# Patient Record
Sex: Female | Born: 1985 | Hispanic: No | Marital: Married | Smoking: Never smoker
Health system: Southern US, Community
[De-identification: ages and names within clinical notes are randomized; demographics above are authoritative.]

## PROBLEM LIST (undated history)

## (undated) ENCOUNTER — Inpatient Hospital Stay (HOSPITAL_COMMUNITY): Payer: Self-pay

---

## 2015-05-31 ENCOUNTER — Inpatient Hospital Stay (HOSPITAL_COMMUNITY)
Admission: AD | Admit: 2015-05-31 | Discharge: 2015-06-03 | DRG: 782 | Disposition: A | Discharge status: Home / Self Care | Payer: Managed Care, Other (non HMO) | Source: Ambulatory Visit | Attending: Obstetrics and Gynecology | Admitting: Obstetrics and Gynecology

## 2015-05-31 ENCOUNTER — Inpatient Hospital Stay (HOSPITAL_COMMUNITY): Payer: Managed Care, Other (non HMO)

## 2015-05-31 ENCOUNTER — Encounter (HOSPITAL_COMMUNITY): Payer: Self-pay

## 2015-05-31 DIAGNOSIS — O3432 Maternal care for cervical incompetence, second trimester: Secondary | ICD-10-CM | POA: Diagnosis present

## 2015-05-31 DIAGNOSIS — D563 Thalassemia minor: Secondary | ICD-10-CM | POA: Diagnosis present

## 2015-05-31 DIAGNOSIS — O3433 Maternal care for cervical incompetence, third trimester: Secondary | ICD-10-CM

## 2015-05-31 DIAGNOSIS — Z833 Family history of diabetes mellitus: Secondary | ICD-10-CM | POA: Diagnosis not present

## 2015-05-31 DIAGNOSIS — Z8249 Family history of ischemic heart disease and other diseases of the circulatory system: Secondary | ICD-10-CM | POA: Diagnosis not present

## 2015-05-31 DIAGNOSIS — Z3A27 27 weeks gestation of pregnancy: Secondary | ICD-10-CM

## 2015-05-31 DIAGNOSIS — Z1389 Encounter for screening for other disorder: Secondary | ICD-10-CM

## 2015-05-31 LAB — CBC
HEMATOCRIT: 29.3 % — AB (ref 36.0–46.0)
HEMOGLOBIN: 9.2 g/dL — AB (ref 12.0–15.0)
MCH: 25.6 pg — ABNORMAL LOW (ref 26.0–34.0)
MCHC: 31.4 g/dL (ref 30.0–36.0)
MCV: 81.6 fL (ref 78.0–100.0)
Platelets: 236 10*3/uL (ref 150–400)
RBC: 3.59 MIL/uL — ABNORMAL LOW (ref 3.87–5.11)
RDW: 18 % — AB (ref 11.5–15.5)
WBC: 8.3 10*3/uL (ref 4.0–10.5)

## 2015-05-31 LAB — ABO/RH: ABO/RH(D): O POS

## 2015-05-31 LAB — TYPE AND SCREEN
ABO/RH(D): O POS
Antibody Screen: NEGATIVE

## 2015-05-31 MED ORDER — BETAMETHASONE SOD PHOS & ACET 6 (3-3) MG/ML IJ SUSP
12.0000 mg | INTRAMUSCULAR | Status: AC
  Administered 2015-05-31 – 2015-06-01 (×2): 12 mg via INTRAMUSCULAR
  Filled 2015-05-31 (×2): qty 2

## 2015-05-31 MED ORDER — ACETAMINOPHEN 325 MG PO TABS: 650.0000 mg | ORAL_TABLET | ORAL | Status: DC | PRN

## 2015-05-31 MED ORDER — ZOLPIDEM TARTRATE 5 MG PO TABS: 5.0000 mg | ORAL_TABLET | Freq: Every evening | ORAL | Status: DC | PRN

## 2015-05-31 MED ORDER — PRENATAL MULTIVITAMIN CH
1.0000 | ORAL_TABLET | Freq: Every day | ORAL | Status: DC
  Administered 2015-06-01 – 2015-06-03 (×3): 1 via ORAL
  Filled 2015-05-31 (×3): qty 1

## 2015-05-31 MED ORDER — CALCIUM CARBONATE ANTACID 500 MG PO CHEW: 2.0000 | CHEWABLE_TABLET | ORAL | Status: DC | PRN

## 2015-05-31 MED ORDER — PROGESTERONE MICRONIZED 200 MG PO CAPS
200.0000 mg | ORAL_CAPSULE | Freq: Every day | ORAL | Status: DC
  Administered 2015-05-31 – 2015-06-02 (×3): 200 mg via VAGINAL
  Filled 2015-05-31 (×3): qty 1

## 2015-05-31 MED ORDER — DOCUSATE SODIUM 100 MG PO CAPS
100.0000 mg | ORAL_CAPSULE | Freq: Every day | ORAL | Status: DC
  Administered 2015-06-01: 100 mg via ORAL
  Filled 2015-05-31 (×2): qty 1

## 2015-05-31 NOTE — Consult Note (Signed)
[facility]  Pren[facility] Consult       05/31/2015  10:29 PM   I was asked by Dr. Cherly Hensenousins to consult on this patient for possible preterm delivery.  I had the pleasure of meeting with Mrs. Tracy [name] and her husband today.  She is a 30 y/o G1P0 at 3727 and 1/[redacted] weeks gestation who was admitted today for cervical change.  Her pregnancy has been complicated by cervical shortening since 20 weeks but has always been closed.  On today's exam, she was noted to be 1 cm dilated and 70% effaced.  She is not contracting.  She received one dose of BMZ today and will get a 2nd tomorrow.  If she remains stable, outpatient management might be possible.    I explained that the neonatal intensive care team would be present for the delivery and outlined the likely delivery room course for this baby including routine resuscitation and NRP-guided approaches to the treatment of respiratory distress. We discussed other common problems associated with prematurity including respiratory distress syndrome/CLD, apnea, feeding issues, temperature regulation, and infection risk.  We briefly discussed IVH/PVL, ROP, and NEC and that these are complications associated with prematurity, but that by 30 weeks are uncommon.    We discussed the average length of stay but I noted that the actual LOS would depend on the severity of problems encountered and response to treatments.  We discussed visitation policies and the resources available while her child is in the hospital.  We discussed the importance of good nutrition and various methods of providing nutrition (parenteral hyperalimentation, gavage feedings and/or oral feeding). We discussed the benefits of human milk. I encouraged breast feeding and pumping soon after birth and outlined resources that are available to support breast feeding.  She does intend to provide breastmilk.    Thank you for involving us in the care of this patient. A member of our team will be  available should the family have additional questions.  Time for consultation approximately 45 minutes.   _____________________ Electronically Signed By: Maryan CharLindsey Perley Arthurs, MD Neonatologist

## 2015-05-31 NOTE — H&P (Signed)
  OB ADMISSION/ HISTORY & PHYSICAL:  Admission Date: 05/31/2015  3:46 PM  Admit Diagnosis: 27.[redacted] weeks gestation, cervical incompetence  Tracy [name] is a 30 y.o. female presenting for cervical shortening with new onset preterm cervical dilation.  Prenatal History:   EDC: 08/29/[facility]  Primary Ob Provider: Raelyn Moraolitta Dawson, CNM and co-management with Dr. Billy Coastaavon Prenatal course complicated by cervical shortening first detected at 20 wks by CUS. CL stable on vaginal Prometrium, 1.8 cm today. Alpha thalassemia trait. Isolated EIF.  Prenatal Labs: ABO, Rh:   O Pos Antibody:  Neg Rubella:   Immune RPR:   Neg HBsAg:   Neg HIV:   Neg GBS:   pending 1 hr GTT: nml Genetic screen: declined  Medical / Surgical History :  Past medical history: none  Past surgical history: none  Family History: HTN-mother, DM-PGM, PGF   Social History: married, no ETOH, tobacco, or DA  Allergies: Review of patient's allergies indicates no known allergies.   Current Medications at time of admission:  Prior to Admission medications     Prometrium 200mg  pv qhs PNV 1 po daily  Review of Systems: +FM + occ. tightening No VB No LOF No ctx  Physical Exam:  VS: 123/72, 78, 16, 98.3  General: alert and oriented, appears comfortable Heart: RRR Lungs: Clear lung fields Abdomen: Gravid, soft and non-tender, non-distended / uterus: gravid, non-tender Extremities: No edema Genitalia / VE:  deferred; SVE 1/70/-4, vtx (in office) FHR: 145 bpm/ mod variability / + accelerations / no decelerations TOCO: none  Assessment: 27.[redacted] weeks gestation Cervical incompetence FHR category I  Plan:  Admit to Ante, continuous EFM, NICU & MFM cosult, BMZ, Sono for EFW, continue Prometrium Dr. Billy Coastaavon & Dr. Cherly Hensenousins aware of admission / MD to follow GBS & fFN pending (collected at office)   Donette LarryBHAMBRI, Tracy [name], N MSN, CNM 05/31/2015, 4:24 PM

## 2015-05-31 NOTE — Consult Note (Signed)
MFM Note  Ms. Sibyl ParrChapman is a 30 year old AA G1 female at 27+1 weeks who was admitted this afternoon from the office due to cervical change on digital exam. Her prenatal course has been complicated by cervical shortening discovered during her anatomy US at 20 weeks (1.7 - 2.1 cms without funneling). At that time, she was started on vaginal Prometrium nightly. Close surveillance of the CL over that past few weeks showed varying lengths from 1.1 to 2.9 cms without funneling. Today, the CL by US was 1.8 cms; however, her digital exam revealed the cervix to be 1/70%/-4. It had been closed on all previous exams. She denies contractions or vaginal bleeding. Reports good fetal movement.  Ultrasound today: cephalic presentation; normal fetal anatomy (limited views of CI); normal AFV; EFW at the 76th %tile (1217 grams, 2+11)  Assessment: 1) SIUP at 27+1 weeks 2) Premature cervical dilation/cx insufficiency 3) Reassuring fetal status 4) Alpha thal carrier  Suggestions:  - agree with giving course of BMZ and obtaining a NICU consult  - continue nightly vaginal progesterone  - limited activity but not strict BR  - recheck cervix by digital exam in ~ 48 hours; if no significant change, consider outpt management with limited activity at home  Please call with any questions or concerns.  (Face-to-face consultation with patient: 30 min)

## 2015-06-01 NOTE — Progress Notes (Addendum)
Patient ID: Tracy AmslerCynthia [name], female   DOB: [birth year], 30 y.o.   MRN: [id number] HD#2 27 2/7 wk IUP Preterm cervical change  S: No complaints. Denies contractions, bleeding or LOF No CP or SOB Good FM  O: BP 136/70 mmHg  Pulse 73  Temp(Src) 98.1 F (36.7 C) (Oral)  Resp 18  Ht 5\' 7"  (1.702 m)  Wt 98.884 kg (218 lb)  BMI 34.14 kg/m2  LMP 11/22/2014 (Exact Date)  HEENT: Normal Neck: supple with FROM Lungs: CTA CV: RRR ABD: Gravid, NT No CVAT Ext : Neg c/c/e Pelvic deferred Neuro: nonfocal Skin intact  CBC    Component Value Date/Time   WBC 8.3 05/31/2015 1637   RBC 3.59* 05/31/2015 1637   HGB 9.2* 05/31/2015 1637   HCT 29.3* 05/31/2015 1637   PLT 236 05/31/2015 1637   MCV 81.6 05/31/2015 1637   MCH 25.6* 05/31/2015 1637   MCHC 31.4 05/31/2015 1637   RDW 18.0* 05/31/2015 1637   MFM sono c/w AGA, nl AFI, cephalic presentation  FHR 140s-150s. BTBV > 5, no decels, 10x10 accels, rare contractions  IMP: 27w 2d IUP History of preterm cervical change on Prometrium now with cervical dilatation No risk factors for cervical insufficiency, no history of preterm birth  Plan: DC continuous monitoring Monitor s/s PTL BMZ series Will rpt VE Monday and possible dc if stable

## 2015-06-02 NOTE — Progress Notes (Signed)
Patient ID: Tracy AmslerCynthia Crull, female   DOB: [birth year], 30 y.o.   MRN: [id number] HD#3 27 3/7 wk IUP Preterm cervical change  S: No complaints. Denies contractions, bleeding or LOF No CP or SOB Good FM  O: BP 141/77 mmHg  Pulse 77  Temp(Src) 98.1 F (36.7 C) (Oral)  Resp 16  Ht 5\' 7"  (1.702 m)  Wt 98.884 kg (218 lb)  BMI 34.14 kg/m2  SpO2 99%  LMP 11/22/2014 (Exact Date)  HEENT: Normal Neck: supple with FROM Lungs: CTA CV: RRR ABD: Gravid, NT No CVAT Ext : Neg c/c/e Pelvic deferred Neuro: nonfocal Skin intact  CBC    Component Value Date/Time   WBC 8.3 05/31/2015 1637   RBC 3.59* 05/31/2015 1637   HGB 9.2* 05/31/2015 1637   HCT 29.3* 05/31/2015 1637   PLT 236 05/31/2015 1637   MCV 81.6 05/31/2015 1637   MCH 25.6* 05/31/2015 1637   MCHC 31.4 05/31/2015 1637   RDW 18.0* 05/31/2015 1637   MFM sono c/w AGA, nl AFI, cephalic presentation  FHR 140s-150s. BTBV > 5, no decels, 10x10 accels, rare contractions Reactive NST x 2  IMP: 27w 3d IUP History of preterm cervical change on Prometrium now with cervical dilatation, no evidence of PTL No risk factors for cervical insufficiency, no history of preterm birth  Plan: Monitor q shift NICU consult done Monitor s/s PTL BMZ series complete Will rpt VE Monday and possible dc if stable

## 2015-06-03 NOTE — Progress Notes (Signed)
Patient ID: Tracy AmslerCynthia [name], female   DOB: 11-28-1985, 30 y.o.   MRN: [id number] HD#4 27 4/7 wk IUP Preterm cervical change  S: No complaints. Denies contractions, bleeding or LOF No CP or SOB Good FM  O: BP 131/73 mmHg  Pulse 73  Temp(Src) 98.3 F (36.8 C) (Oral)  Resp 16  Ht 5\' 7"  (1.702 m)  Wt 98.884 kg (218 lb)  BMI 34.14 kg/m2  SpO2 99%  LMP 11/22/2014 (Exact Date)  HEENT: Normal Neck: supple with FROM Lungs: CTA CV: RRR ABD: Gravid, NT No CVAT Ext : Neg c/c/e Pelvic FT/70/-3 Neuro: nonfocal Skin intact  CBC    Component Value Date/Time   WBC 8.3 05/31/2015 1637   RBC 3.59* 05/31/2015 1637   HGB 9.2* 05/31/2015 1637   HCT 29.3* 05/31/2015 1637   PLT 236 05/31/2015 1637   MCV 81.6 05/31/2015 1637   MCH 25.6* 05/31/2015 1637   MCHC 31.4 05/31/2015 1637   RDW 18.0* 05/31/2015 1637   MFM sono c/w AGA, nl AFI, cephalic presentation FFN neg  FHR 140s-160s. BTBV > 5, no decels, 10x10 accels, rare contractions Reactive NST x 3  IMP: 27w 4d IUP History of preterm cervical change on Prometrium now with cervical dilatation-stable on fu exam No evidence of PTL No risk factors for cervical insufficiency, no history of preterm birth  Plan: DC home Fu office one week NICU consult done Monitor s/s PTL BMZ series complete Vaginal prometrium qhs

## 2015-06-03 NOTE — Discharge Instructions (Signed)

## 2015-06-09 NOTE — Discharge Summary (Signed)
NAMMarya Amsler:  [place], [place]             ACCOUNT NO.:  [id number]  MEDICAL RECORD NO.:  [id number]  LOCATION:  9156                          FACILITY:  [facility]  PHYSICIAN:  Lenoard Adenichard J. Fernandez Kenley, M.D.DATE OF BIRTH:  December 29, 1985  DATE OF ADMISSION:  05/31/2015 DATE OF DISCHARGE:  06/03/2015                              DISCHARGE SUMMARY   Patient is admitted for preterm cervical change 27 weeks with cervical dilatation noted on May 31, 2015.  Hospital course uncomplicated.  No contraction was noted.  Betamethasone series given.  No active signs of infection.  Cervical exam was stable on hospital day #3.  She was discharged to home on bedrest preterm labor precautions.  DISCHARGE MEDICATIONS:  Prometrium and prenatal vitamins.  She is to follow up in the office in 1 week.     Lenoard Adenichard J. Cherica Heiden, M.D.     RJT/MEDQ  D:  06/09/2015  T:  06/09/2015  Job:  [id number]

## 2015-07-30 ENCOUNTER — Inpatient Hospital Stay (HOSPITAL_COMMUNITY): Payer: Managed Care, Other (non HMO) | Admitting: Certified Registered Nurse Anesthetist

## 2015-07-30 ENCOUNTER — Encounter (HOSPITAL_COMMUNITY)
Admission: AD | Disposition: A | Discharge status: Home / Self Care | Payer: Self-pay | Source: Ambulatory Visit | Attending: Obstetrics & Gynecology

## 2015-07-30 ENCOUNTER — Inpatient Hospital Stay (HOSPITAL_COMMUNITY): Payer: Managed Care, Other (non HMO)

## 2015-07-30 ENCOUNTER — Inpatient Hospital Stay (HOSPITAL_COMMUNITY)
Admission: AD | Admit: 2015-07-30 | Discharge: 2015-08-02 | DRG: 765 | Disposition: A | Discharge status: Home / Self Care | Payer: Managed Care, Other (non HMO) | Source: Ambulatory Visit | Attending: Obstetrics & Gynecology | Admitting: Obstetrics & Gynecology

## 2015-07-30 ENCOUNTER — Encounter (HOSPITAL_COMMUNITY): Payer: Self-pay | Admitting: *Deleted

## 2015-07-30 DIAGNOSIS — D62 Acute posthemorrhagic anemia: Secondary | ICD-10-CM

## 2015-07-30 DIAGNOSIS — Z3A35 35 weeks gestation of pregnancy: Secondary | ICD-10-CM

## 2015-07-30 DIAGNOSIS — Z8249 Family history of ischemic heart disease and other diseases of the circulatory system: Secondary | ICD-10-CM

## 2015-07-30 DIAGNOSIS — Z9889 Other specified postprocedural states: Secondary | ICD-10-CM

## 2015-07-30 DIAGNOSIS — O9081 Anemia of the puerperium: Secondary | ICD-10-CM | POA: Diagnosis not present

## 2015-07-30 DIAGNOSIS — O134 Gestational [pregnancy-induced] hypertension without significant proteinuria, complicating childbirth: Secondary | ICD-10-CM

## 2015-07-30 DIAGNOSIS — O288 Other abnormal findings on antenatal screening of mother: Secondary | ICD-10-CM

## 2015-07-30 DIAGNOSIS — Z833 Family history of diabetes mellitus: Secondary | ICD-10-CM

## 2015-07-30 DIAGNOSIS — O368130 Decreased fetal movements, third trimester, not applicable or unspecified: Principal | ICD-10-CM | POA: Diagnosis present

## 2015-07-30 DIAGNOSIS — O368190 Decreased fetal movements, unspecified trimester, not applicable or unspecified: Secondary | ICD-10-CM

## 2015-07-30 LAB — COMPREHENSIVE METABOLIC PANEL
ALT: 12 U/L — ABNORMAL LOW (ref 14–54)
AST: 20 U/L (ref 15–41)
Albumin: 2.9 g/dL — ABNORMAL LOW (ref 3.5–5.0)
Alkaline Phosphatase: 121 U/L (ref 38–126)
Anion gap: 7 (ref 5–15)
BUN: 5 mg/dL — ABNORMAL LOW (ref 6–20)
CO2: 20 mmol/L — ABNORMAL LOW (ref 22–32)
Calcium: 9 mg/dL (ref 8.9–10.3)
Chloride: 108 mmol/L (ref 101–111)
Creatinine, Ser: 0.69 mg/dL (ref 0.44–1.00)
GFR calc Af Amer: >60 mL/min (ref >60)
GFR calc non Af Amer: >60 mL/min (ref >60)
Glucose, Bld: 88 mg/dL (ref 65–99)
Potassium: 3.6 mmol/L (ref 3.5–5.1)
Sodium: 135 mmol/L (ref 135–145)
Total Bilirubin: 0.5 mg/dL (ref 0.3–1.2)
Total Protein: 7 g/dL (ref 6.5–8.1)

## 2015-07-30 LAB — CBC
HCT: 34.5 % — ABNORMAL LOW (ref 36.0–46.0)
Hemoglobin: 11.4 g/dL — ABNORMAL LOW (ref 12.0–15.0)
MCH: 28.1 pg (ref 26.0–34.0)
MCHC: 33 g/dL (ref 30.0–36.0)
MCV: 85.2 fL (ref 78.0–100.0)
Platelets: 205 10*3/uL (ref 150–400)
RBC: 4.05 MIL/uL (ref 3.87–5.11)
RDW: 18 % — ABNORMAL HIGH (ref 11.5–15.5)
WBC: 7.9 10*3/uL (ref 4.0–10.5)

## 2015-07-30 LAB — PROTEIN / CREATININE RATIO, URINE
Creatinine, Urine: 122 mg/dL
Protein Creatinine Ratio: 0.1 mg/mg{Cre} (ref 0.00–0.15)
Total Protein, Urine: 12 mg/dL

## 2015-07-30 LAB — URIC ACID: Uric Acid, Serum: 4.2 mg/dL (ref 2.3–6.6)

## 2015-07-30 SURGERY — Surgical Case
Anesthesia: Spinal

## 2015-07-30 MED ORDER — FENTANYL CITRATE (PF) 100 MCG/2ML IJ SOLN
INTRAMUSCULAR | Status: AC
  Filled 2015-07-30: qty 2

## 2015-07-30 MED ORDER — BUPIVACAINE HCL (PF) 0.25 % IJ SOLN
INTRAMUSCULAR | Status: AC
  Filled 2015-07-30: qty 20

## 2015-07-30 MED ORDER — ZOLPIDEM TARTRATE 5 MG PO TABS: 5.0000 mg | ORAL_TABLET | Freq: Every evening | ORAL | Status: DC | PRN

## 2015-07-30 MED ORDER — MORPHINE SULFATE (PF) 0.5 MG/ML IJ SOLN
INTRAMUSCULAR | Status: AC
  Filled 2015-07-30: qty 10

## 2015-07-30 MED ORDER — OXYCODONE HCL 5 MG PO TABS: 10.0000 mg | ORAL_TABLET | ORAL | Status: DC | PRN

## 2015-07-30 MED ORDER — SIMETHICONE 80 MG PO CHEW
80.0000 mg | CHEWABLE_TABLET | ORAL | Status: DC
  Administered 2015-07-30 – 2015-08-01 (×3): 80 mg via ORAL
  Filled 2015-07-30 (×3): qty 1

## 2015-07-30 MED ORDER — OXYTOCIN 10 UNIT/ML IJ SOLN: 2.5000 [IU]/h | INTRAVENOUS | Status: AC

## 2015-07-30 MED ORDER — LACTATED RINGERS IV SOLN
INTRAVENOUS | Status: DC
  Administered 2015-07-30 (×2): via INTRAVENOUS

## 2015-07-30 MED ORDER — ACETAMINOPHEN 325 MG PO TABS: 650.0000 mg | ORAL_TABLET | ORAL | Status: DC | PRN

## 2015-07-30 MED ORDER — BUPIVACAINE IN DEXTROSE 0.75-8.25 % IT SOLN
INTRATHECAL | Status: DC | PRN
  Administered 2015-07-30: 1.8 mL via INTRATHECAL

## 2015-07-30 MED ORDER — MEPERIDINE HCL 25 MG/ML IJ SOLN: 6.2500 mg | INTRAMUSCULAR | Status: DC | PRN

## 2015-07-30 MED ORDER — NALBUPHINE HCL 10 MG/ML IJ SOLN: 5.0000 mg | INTRAMUSCULAR | Status: DC | PRN

## 2015-07-30 MED ORDER — NALBUPHINE HCL 10 MG/ML IJ SOLN: 5.0000 mg | Freq: Once | INTRAMUSCULAR | Status: DC | PRN

## 2015-07-30 MED ORDER — NALOXONE HCL 2 MG/2ML IJ SOSY: 1.0000 ug/kg/h | PREFILLED_SYRINGE | INTRAVENOUS | Status: DC | PRN

## 2015-07-30 MED ORDER — MAGNESIUM HYDROXIDE 400 MG/5ML PO SUSP: 30.0000 mL | ORAL | Status: DC | PRN

## 2015-07-30 MED ORDER — MENTHOL 3 MG MT LOZG
1.0000 | LOZENGE | OROMUCOSAL | Status: DC | PRN
Start: 2015-07-30 — End: 2015-08-02

## 2015-07-30 MED ORDER — DIPHENHYDRAMINE HCL 50 MG/ML IJ SOLN: 12.5000 mg | INTRAMUSCULAR | Status: DC | PRN

## 2015-07-30 MED ORDER — PROPOFOL 10 MG/ML IV BOLUS
INTRAVENOUS | Status: AC
  Filled 2015-07-30: qty 20

## 2015-07-30 MED ORDER — BUPIVACAINE HCL 0.25 % IJ SOLN
INTRAMUSCULAR | Status: DC | PRN
  Administered 2015-07-30: 20 mL

## 2015-07-30 MED ORDER — TETANUS-DIPHTH-ACELL PERTUSSIS 5-2.5-18.5 LF-MCG/0.5 IM SUSP: 0.5000 mL | Freq: Once | INTRAMUSCULAR | Status: DC

## 2015-07-30 MED ORDER — SCOPOLAMINE 1 MG/3DAYS TD PT72
MEDICATED_PATCH | TRANSDERMAL | Status: DC | PRN
  Administered 2015-07-30: 1 via TRANSDERMAL

## 2015-07-30 MED ORDER — DIPHENHYDRAMINE HCL 25 MG PO CAPS
25.0000 mg | ORAL_CAPSULE | ORAL | Status: DC | PRN
  Filled 2015-07-30: qty 1

## 2015-07-30 MED ORDER — SENNOSIDES-DOCUSATE SODIUM 8.6-50 MG PO TABS
2.0000 | ORAL_TABLET | ORAL | Status: DC
  Administered 2015-07-30: 2 via ORAL
  Filled 2015-07-30 (×3): qty 2

## 2015-07-30 MED ORDER — SIMETHICONE 80 MG PO CHEW: 80.0000 mg | CHEWABLE_TABLET | ORAL | Status: DC | PRN

## 2015-07-30 MED ORDER — IBUPROFEN 600 MG PO TABS
600.0000 mg | ORAL_TABLET | Freq: Four times a day (QID) | ORAL | Status: DC
  Administered 2015-07-30 – 2015-08-02 (×11): 600 mg via ORAL
  Filled 2015-07-30 (×11): qty 1

## 2015-07-30 MED ORDER — FERROUS SULFATE 325 (65 FE) MG PO TABS
325.0000 mg | ORAL_TABLET | Freq: Two times a day (BID) | ORAL | Status: DC
  Administered 2015-07-30 – 2015-08-02 (×6): 325 mg via ORAL
  Filled 2015-07-30 (×6): qty 1

## 2015-07-30 MED ORDER — OXYCODONE HCL 5 MG PO TABS
5.0000 mg | ORAL_TABLET | ORAL | Status: DC | PRN
  Administered 2015-07-31: 5 mg via ORAL
  Filled 2015-07-30: qty 1

## 2015-07-30 MED ORDER — PHENYLEPHRINE 8 MG IN D5W 100 ML (0.08MG/ML) PREMIX OPTIME
INJECTION | INTRAVENOUS | Status: AC
  Filled 2015-07-30: qty 100

## 2015-07-30 MED ORDER — SODIUM CHLORIDE 0.9% FLUSH: 3.0000 mL | INTRAVENOUS | Status: DC | PRN

## 2015-07-30 MED ORDER — PRENATAL MULTIVITAMIN CH
1.0000 | ORAL_TABLET | Freq: Every day | ORAL | Status: DC
  Administered 2015-07-31 – 2015-08-02 (×3): 1 via ORAL
  Filled 2015-07-30 (×3): qty 1

## 2015-07-30 MED ORDER — FAMOTIDINE IN NACL 20-0.9 MG/50ML-% IV SOLN
INTRAVENOUS | Status: AC
  Administered 2015-07-30: 12:00:00
  Filled 2015-07-30: qty 50

## 2015-07-30 MED ORDER — NALOXONE HCL 0.4 MG/ML IJ SOLN: 0.4000 mg | INTRAMUSCULAR | Status: DC | PRN

## 2015-07-30 MED ORDER — CEFAZOLIN SODIUM-DEXTROSE 2-3 GM-% IV SOLR
INTRAVENOUS | Status: DC | PRN
  Administered 2015-07-30: 2 g via INTRAVENOUS

## 2015-07-30 MED ORDER — LACTATED RINGERS IV SOLN
INTRAVENOUS | Status: DC
  Administered 2015-07-30 – 2015-07-31 (×2): via INTRAVENOUS

## 2015-07-30 MED ORDER — MORPHINE SULFATE (PF) 0.5 MG/ML IJ SOLN
INTRAMUSCULAR | Status: DC | PRN
  Administered 2015-07-30: .2 mg via INTRATHECAL

## 2015-07-30 MED ORDER — CEFAZOLIN SODIUM-DEXTROSE 2-4 GM/100ML-% IV SOLN
INTRAVENOUS | Status: AC
  Filled 2015-07-30: qty 100

## 2015-07-30 MED ORDER — KETOROLAC TROMETHAMINE 30 MG/ML IJ SOLN
INTRAMUSCULAR | Status: AC
  Administered 2015-07-30: 30 mg via INTRAMUSCULAR
  Filled 2015-07-30: qty 1

## 2015-07-30 MED ORDER — ONDANSETRON HCL 4 MG/2ML IJ SOLN: 4.0000 mg | Freq: Three times a day (TID) | INTRAMUSCULAR | Status: DC | PRN

## 2015-07-30 MED ORDER — ONDANSETRON HCL 4 MG/2ML IJ SOLN
INTRAMUSCULAR | Status: DC | PRN
  Administered 2015-07-30: 4 mg via INTRAVENOUS

## 2015-07-30 MED ORDER — KETOROLAC TROMETHAMINE 30 MG/ML IJ SOLN: 30.0000 mg | Freq: Four times a day (QID) | INTRAMUSCULAR | Status: AC | PRN

## 2015-07-30 MED ORDER — PHENYLEPHRINE 8 MG IN D5W 100 ML (0.08MG/ML) PREMIX OPTIME
INJECTION | INTRAVENOUS | Status: DC | PRN
  Administered 2015-07-30: 60 ug/min via INTRAVENOUS

## 2015-07-30 MED ORDER — OXYTOCIN 10 UNIT/ML IJ SOLN
40.0000 [IU] | INTRAVENOUS | Status: DC | PRN
  Administered 2015-07-30: 40 [IU] via INTRAVENOUS

## 2015-07-30 MED ORDER — CITRIC ACID-SODIUM CITRATE 334-500 MG/5ML PO SOLN
ORAL | Status: AC
  Administered 2015-07-30: 12:00:00
  Filled 2015-07-30: qty 15

## 2015-07-30 MED ORDER — SIMETHICONE 80 MG PO CHEW
80.0000 mg | CHEWABLE_TABLET | Freq: Three times a day (TID) | ORAL | Status: DC
  Administered 2015-07-30 – 2015-08-02 (×9): 80 mg via ORAL
  Filled 2015-07-30 (×9): qty 1

## 2015-07-30 MED ORDER — SCOPOLAMINE 1 MG/3DAYS TD PT72
MEDICATED_PATCH | TRANSDERMAL | Status: AC
  Filled 2015-07-30: qty 1

## 2015-07-30 MED ORDER — KETOROLAC TROMETHAMINE 30 MG/ML IJ SOLN
30.0000 mg | Freq: Four times a day (QID) | INTRAMUSCULAR | Status: AC | PRN
  Administered 2015-07-30: 30 mg via INTRAMUSCULAR

## 2015-07-30 MED ORDER — SODIUM CHLORIDE 0.9 % IR SOLN
Status: DC | PRN
  Administered 2015-07-30: 1

## 2015-07-30 MED ORDER — SCOPOLAMINE 1 MG/3DAYS TD PT72: 1.0000 | MEDICATED_PATCH | Freq: Once | TRANSDERMAL | Status: DC

## 2015-07-30 MED ORDER — WITCH HAZEL-GLYCERIN EX PADS: 1.0000 "application " | MEDICATED_PAD | CUTANEOUS | Status: DC | PRN

## 2015-07-30 MED ORDER — COCONUT OIL OIL: 1.0000 "application " | TOPICAL_OIL | Status: DC | PRN

## 2015-07-30 MED ORDER — OXYTOCIN 10 UNIT/ML IJ SOLN
INTRAMUSCULAR | Status: AC
  Filled 2015-07-30: qty 4

## 2015-07-30 MED ORDER — FENTANYL CITRATE (PF) 100 MCG/2ML IJ SOLN
INTRAMUSCULAR | Status: DC | PRN
  Administered 2015-07-30: 10 ug via INTRATHECAL

## 2015-07-30 MED ORDER — ONDANSETRON HCL 4 MG/2ML IJ SOLN
INTRAMUSCULAR | Status: AC
  Filled 2015-07-30: qty 2

## 2015-07-30 MED ORDER — DIBUCAINE 1 % RE OINT: 1.0000 "application " | TOPICAL_OINTMENT | RECTAL | Status: DC | PRN

## 2015-07-30 MED ORDER — LACTATED RINGERS IV SOLN
INTRAVENOUS | Status: DC | PRN
  Administered 2015-07-30: 13:00:00 via INTRAVENOUS

## 2015-07-30 MED ORDER — DIPHENHYDRAMINE HCL 25 MG PO CAPS: 25.0000 mg | ORAL_CAPSULE | Freq: Four times a day (QID) | ORAL | Status: DC | PRN

## 2015-07-30 SURGICAL SUPPLY — 39 items
CLAMP CORD UMBIL (MISCELLANEOUS) IMPLANT
CLOTH BEACON ORANGE TIMEOUT ST (SAFETY) ×3 IMPLANT
CONTAINER PREFILL 10% NBF 15ML (MISCELLANEOUS) IMPLANT
DRSG OPSITE POSTOP 4X10 (GAUZE/BANDAGES/DRESSINGS) ×3 IMPLANT
DURAPREP 26ML APPLICATOR (WOUND CARE) ×3 IMPLANT
ELECT REM PT RETURN 9FT ADLT (ELECTROSURGICAL) ×3
ELECTRODE REM PT RTRN 9FT ADLT (ELECTROSURGICAL) ×1 IMPLANT
EXTRACTOR VACUUM M CUP 4 TUBE (SUCTIONS) IMPLANT
EXTRACTOR VACUUM M CUP 4' TUBE (SUCTIONS)
GLOVE BIO SURGEON STRL SZ 6.5 (GLOVE) ×2 IMPLANT
GLOVE BIO SURGEONS STRL SZ 6.5 (GLOVE) ×1
GLOVE BIOGEL PI IND STRL 7.0 (GLOVE) ×2 IMPLANT
GLOVE BIOGEL PI INDICATOR 7.0 (GLOVE) ×4
GOWN STRL REUS W/TWL LRG LVL3 (GOWN DISPOSABLE) ×6 IMPLANT
KIT ABG SYR 3ML LUER SLIP (SYRINGE) ×3 IMPLANT
LIQUID BAND (GAUZE/BANDAGES/DRESSINGS) IMPLANT
NEEDLE HYPO 22GX1.5 SAFETY (NEEDLE) ×3 IMPLANT
NEEDLE HYPO 25X5/8 SAFETYGLIDE (NEEDLE) ×3 IMPLANT
PACK C SECTION WH (CUSTOM PROCEDURE TRAY) ×3 IMPLANT
PAD OB MATERNITY 4.3X12.25 (PERSONAL CARE ITEMS) ×3 IMPLANT
RTRCTR C-SECT PINK 25CM LRG (MISCELLANEOUS) ×3 IMPLANT
STAPLER VISISTAT 35W (STAPLE) ×3 IMPLANT
SUT MNCRL AB 3-0 PS2 27 (SUTURE) ×3 IMPLANT
SUT MON AB 4-0 PS1 27 (SUTURE) IMPLANT
SUT PLAIN 0 NONE (SUTURE) IMPLANT
SUT PLAIN 2 0 (SUTURE) ×2
SUT PLAIN ABS 2-0 CT1 27XMFL (SUTURE) ×1 IMPLANT
SUT VIC AB 0 CT1 27 (SUTURE) ×4
SUT VIC AB 0 CT1 27XBRD ANBCTR (SUTURE) ×2 IMPLANT
SUT VIC AB 0 CTX 36 (SUTURE) ×6
SUT VIC AB 0 CTX36XBRD ANBCTRL (SUTURE) ×3 IMPLANT
SUT VIC AB 2-0 CT1 27 (SUTURE) ×2
SUT VIC AB 2-0 CT1 TAPERPNT 27 (SUTURE) ×1 IMPLANT
SUT VIC AB 3-0 SH 27 (SUTURE)
SUT VIC AB 3-0 SH 27X BRD (SUTURE) IMPLANT
SUT VIC AB 4-0 PS2 27 (SUTURE) IMPLANT
SYR CONTROL 10ML LL (SYRINGE) ×6 IMPLANT
TOWEL OR 17X24 6PK STRL BLUE (TOWEL DISPOSABLE) ×3 IMPLANT
TRAY FOLEY CATH SILVER 14FR (SET/KITS/TRAYS/PACK) ×3 IMPLANT

## 2015-07-30 NOTE — Addendum Note (Signed)
Addendum  created 07/30/15 1616 by Yolonda KidaAlison L Rodriquez Thorner, CRNA   Modules edited: Clinical Notes   Clinical Notes:  File: [id number]

## 2015-07-30 NOTE — MAU Note (Signed)
Sent from office for further eval. Had come in with c/o decreased fetal movement , non-reactive NST per CNM.  BP was elevated.

## 2015-07-30 NOTE — Progress Notes (Signed)
Abdomen shaved, CHG wipes to belly, betadine nasal prep done

## 2015-07-30 NOTE — Anesthesia Preprocedure Evaluation (Signed)
Anesthesia Evaluation  Patient identified by MRN, date of birth, ID band Patient awake    Reviewed: Allergy & Precautions, NPO status , Patient's Chart, lab work & pertinent test resultsPreop documentation limited or incomplete due to emergent nature of procedure.  Airway Mallampati: II  TM Distance: >3 FB     Dental   Pulmonary neg pulmonary ROS,    Pulmonary exam normal        Cardiovascular negative cardio ROS Normal cardiovascular exam     Neuro/Psych negative neurological ROS     GI/Hepatic negative GI ROS, Neg liver ROS,   Endo/Other  negative endocrine ROS  Renal/GU negative Renal ROS     Musculoskeletal   Abdominal   Peds  Hematology negative hematology ROS (+)   Anesthesia Other Findings   Reproductive/Obstetrics                             Lab Results  Component Value Date   WBC 7.9 07/30/2015   HGB 11.4* 07/30/2015   HCT 34.5* 07/30/2015   MCV 85.2 07/30/2015   PLT 205 07/30/2015   Lab Results  Component Value Date   CREATININE 0.69 07/30/2015   BUN 5* 07/30/2015   NA 135 07/30/2015   K 3.6 07/30/2015   CL 108 07/30/2015   CO2 20* 07/30/2015    Anesthesia Physical Anesthesia Plan  ASA: II and emergent  Anesthesia Plan: Spinal   Post-op Pain Management:    Induction:   Airway Management Planned: Natural Airway  Additional Equipment:   Intra-op Plan:   Post-operative Plan:   Informed Consent: I have reviewed the patients History and Physical, chart, labs and discussed the procedure including the risks, benefits and alternatives for the proposed anesthesia with the patient or authorized representative who has indicated his/her understanding and acceptance.     Plan Discussed with: CRNA and Surgeon  Anesthesia Plan Comments:         Anesthesia Quick Evaluation

## 2015-07-30 NOTE — Lactation Note (Signed)
This note was copied from a baby's chart. Lactation Consultation Note  Patient Name: Tracy [name]: 07/30/2015 Reason for consult: Initial assessment;NICU baby;Late preterm infant   Initial consult with first time mom of infant born at 3035 w 5 d and taken to NICU. Infant is on CPAP.   Mom reports she would like to BF. Mom and Dad at bedside for teaching, DEBP set up with instructions for assembling, disassembling and cleaning of pump parts. Advised mom to pump every 2-3 hours on Initiate setting with Symphony DEBP for 15 minutes followed by hand expression. Mom was taught hand expression, no colostrum obtained at this visit. Mom with large compressible breasts and everted nipples, #24 flanges thought to be a good fit.   Gave mom Providing Milk for My Baby in NICU booklet, reviewed pumping schedule and milk storage. Reviewed what to expect with NICU infant and BF. BF Resources handout and LC Brochure given, informed mom of LC services, BF Support Groups and LC Phone #. Enc mom to call for questions/concerns prn. Follow up tomorrow and prn.    Maternal Data Formula Feeding for Exclusion: No Has patient been taught Hand Expression?: Yes Does the patient have breastfeeding experience prior to this delivery?: No  Feeding    LATCH Score/Interventions                      Lactation Tools Discussed/Used WIC Program: No Pump Review: Setup, frequency, and cleaning;Milk Storage (NICU Milk Storage guidelines) Initiated by:: Noralee StainSharon Jearld Hemp, RN, IBCLC [name] initiated:: 07/30/15   Consult Status Consult Status: Follow-up [name]: 07/31/15 Follow-up type: In-patient    Silas FloodSharon S Beverlie Kurihara 07/30/2015, 4:09 PM

## 2015-07-30 NOTE — Anesthesia Procedure Notes (Signed)
Spinal Patient location during procedure: OB Staffing Anesthesiologist: Marcene DuosFITZGERALD, Letroy Vazguez Performed by: anesthesiologist  Preanesthetic Checklist Completed: patient identified, site marked, surgical consent, pre-op evaluation, timeout performed, IV checked, risks and benefits discussed and monitors and equipment checked Spinal Block Patient position: right lateral decubitus Prep: site prepped and draped and DuraPrep Patient monitoring: heart rate, continuous pulse ox and blood pressure Approach: midline Location: L4-5 Injection technique: single-shot Needle Needle type: Pencan  Needle gauge: 24 G Needle length: 9 cm Assessment Sensory level: T6

## 2015-07-30 NOTE — H&P (Signed)
  OB ADMISSION/ HISTORY & PHYSICAL:  Admission Date: 07/30/2015 11:15 AM  Admit Diagnosis: 35.5 weeks / decreased fetal movement / non-reassuring fetal testing  Tracy AmslerCynthia [name] is a 30 y.o. female presenting for decreased FM x 24 hours.  Prenatal History: G1P0   EDC : 08/29/2015, by Last Menstrual Period  [facility]  Primary Ob Provider: Arita Missawson Prenatal course complicated by PTL with advance dilation antepartum to 3-4 cm / BMZ course given  NST at office 150 decreased variability with late decel with single ctx on tracing - sent to MAU urgently NST in MAU non-reactive / decreased to absent variablity / no accels / late decels Bedside BPP 2/8 concurrent with provider discussion for delivery  Prenatal Labs: ABO, Rh: --/--/O POS (03/03 1637) Antibody: NEG (03/03 1637) Rubella:   Immune RPR:   NR HBsAg:   Negative HIV:   NR GTT: NL GBS:   POSITIVE  Medical / Surgical History :  Past medical history: History reviewed. No pertinent past medical history.   Past surgical history: History reviewed. No pertinent past surgical history.  Family History:  Family History  Problem Relation Age of Onset  . Heart disease Mother   . Diabetes Maternal Grandmother   . Diabetes Maternal Grandfather      Social History:  reports that she has never smoked. She has never used smokeless tobacco. She reports that she does not drink alcohol or use illicit drugs.   Allergies: Review of patient's allergies indicates no known allergies.    Current Medications at time of admission:  Prior to Admission medications   Medication Sig Start Date End Date Taking? Authorizing Provider  Prenatal Vit-Fe Fumarate-FA (PRENATAL MULTIVITAMIN) TABS tablet Take 1 tablet by mouth daily at 12 noon.   Yes Historical Provider, MD  progesterone (PROMETRIUM) 200 MG capsule Place 200 mg vaginally at bedtime. 05/10/15  Yes Historical Provider, MD   Review of Systems: Decreased FM No  PIH symptoms No labor signs - aware of ctx  Physical Exam:  VS: Blood pressure 94/72, pulse 89, temperature 98.4 F (36.9 C), temperature source Oral, resp. rate 16, height 5\' 8"  (1.727 m), weight 103.511 kg (228 lb 3.2 oz), last menstrual period 11/22/2014.  General: alert and oriented, appears comfortable - no pain Heart: RRR Lungs: Clear lung fields Abdomen: Gravid, soft and non-tender, non-distended / uterus: gravid and non-tender Extremities: 1+ pedal edema  Genitalia / VE:  deferred  FHR: baseline rate 160 / variability absent / accelerations no / late decelerations TOCO: Q4 with UI  Assessment: 35.[redacted] weeks gestation with decreased FM Abnormal / non-reassuring fetal testing PIH   Plan:  Admit Stat CS deilvery  Dr Seymour BarsLavoie notified of admission / plan of care.  Agree with above.   Tracy DelMarie-Lyne Jash Wahlen MD     Marlinda MikeBAILEY, TANYA CNM, MSN, [facility]ACNM 07/30/2015, 1:14 PM

## 2015-07-30 NOTE — Progress Notes (Signed)
US at bedside doing stat BPP, Marlinda Mikeanya Bailey, CNM called by MAU providers to obtain order.

## 2015-07-30 NOTE — Anesthesia Postprocedure Evaluation (Signed)
Anesthesia Post Note  Patient: Marya AmslerCynthia Lubrano  Procedure(s) Performed: Procedure(s) (LRB): CESAREAN SECTION (N/A)  Patient location during evaluation: [facility] Unit Anesthesia Type: Spinal Level of consciousness: awake, awake and alert, oriented and patient cooperative Pain management: pain level controlled Vital Signs Assessment: post-procedure vital signs reviewed and stable Respiratory status: spontaneous breathing, nonlabored ventilation and respiratory function stable Cardiovascular status: stable Postop Assessment: no headache, no backache, patient able to bend at knees and no signs of nausea or vomiting Anesthetic complications: no     Last Vitals:  Filed Vitals:   07/30/15 1515 07/30/15 1550  BP: 132/73   Pulse: 68 67  Temp: 36.5 C   Resp: 18     Last Pain:  Filed Vitals:   07/30/15 1557  PainSc: 1    Pain Goal: Patients Stated Pain Goal: 1 (07/30/15 1520)               Merwyn Hodapp L

## 2015-07-30 NOTE — Op Note (Signed)
Preoperative diagnosis: Intrauterine pregnancy at 35 5/7 weeks                                            Non-reassuring Fetal-Well-being (BPP 2/10, late decelerations on FHR monitoring)                                            Pregnancy induced hypertension  Post operative diagnosis: Same  Anesthesia: Spinal  Anesthesiologist: Dr. Marcene Duosobert Fitzgerald  Procedure: Primary stat low transverse cesarean section  Surgeon: Dr. Genia DelMarie-Lyne Alliya Marcon  Assistant: Marlinda Mikeanya Bailey   Estimated blood loss: 1000 cc  Procedure:  After being informed of the planned procedure and possible complications including bleeding, infection, injury to other organs, informed consent is obtained. The patient is taken to OR #2 and given spinal anesthesia without complication. She is placed in the dorsal decubitus position with the pelvis tilted to the left. She is then prepped and draped in a sterile fashion. A Foley catheter is inserted in her bladder.  After assessing adequate level of anesthesia, we infiltrate the suprapubic area with 20 cc of Marcaine 0.25 and perform a Pfannenstiel incision which is brought down sharply to the fascia. The fascia is entered in a low transverse fashion. Linea alba is dissected. Peritoneum is entered in a midline fashion. An Alexis retractor is easily positioned.  The myometrium is entered in a low transverse fashion; first with knife and then extended bluntly. Amniotic fluid is clear. We assist the birth of a female infant in cephalic presentation at 12:36 pm. Mouth and nose are suctioned. The baby is delivered. The cord is clamped and sectioned. The baby is given to the neonatologist present in the room.  10 cc of blood is drawn from the umbilical vein. A cord PH is done, which came back at 6.997. The placenta is allowed to deliver spontaneously. It is complete and the cord has 3 vessels. Uterine revision is negative.  We proceed with closure of the myometrium in 2 layers: First with a  running locked suture of 0 Vicryl, then with a Lembert suture of 0 Vicryl imbricating the first one. Hemostasis is completed with cauterization on peritoneal edges.  Both paracolic gutters are cleaned. Both tubes and ovaries are assessed and normal.  We confirm a satisfactory hemostasis.  Retractors and sponges are removed. Under fascia hemostasis is completed with cauterization.  The visceral peritoneum is closed in a running suture of Vicryl 2-0.  The fascia is then closed with 2 running sutures of 0 Vicryl meeting midline. The wound is irrigated with warm saline and hemostasis is completed with cauterization. The skin is approximated with staples.  A Honeycomb dressing is applied.   Instrument and sponge count is complete x2. Estimated blood loss is 1000 cc.  The procedure is well tolerated by the patient who is taken to recovery room in a well and stable condition.  female baby was born at  and received an Apgars per NICU pending.  Weight was pending.    Specimen: Placenta sent to Patho.   Adelaine Roppolo,MARIE-LYNE MD 5/2/20171:29 PM

## 2015-07-30 NOTE — Anesthesia Postprocedure Evaluation (Signed)
Anesthesia Post Note  Patient: Marya AmslerCynthia Gish  Procedure(s) Performed: Procedure(s) (LRB): CESAREAN SECTION (N/A)  Patient location during evaluation: PACU Anesthesia Type: Spinal and MAC Level of consciousness: awake and alert Pain management: pain level controlled Vital Signs Assessment: post-procedure vital signs reviewed and stable Respiratory status: spontaneous breathing and respiratory function stable Cardiovascular status: blood pressure returned to baseline and stable Postop Assessment: spinal receding Anesthetic complications: no     Last Vitals:  Filed Vitals:   07/30/15 1445 07/30/15 1500  BP: 132/68 125/72  Pulse: 69 72  Temp: 36.4 C   Resp: 20 20    Last Pain:  Filed Vitals:   07/30/15 1503  PainSc: 0-No pain   Pain Goal:                 Kennieth RadFitzgerald, Mayzee Reichenbach E

## 2015-07-30 NOTE — Transfer of Care (Signed)
Immediate Anesthesia Transfer of Care Note  Patient: Tracy [name]  Procedure(s) Performed: Procedure(s): CESAREAN SECTION (N/A)  Patient Location: PACU  Anesthesia Type:Spinal  Level of Consciousness: awake, alert  and oriented  Airway & Oxygen Therapy: Patient Spontanous Breathing  Post-op Assessment: Report given to RN and Post -op Vital signs reviewed and stable  Post vital signs: Reviewed and stable  Last Vitals:  Filed Vitals:   07/30/15 1205 07/30/15 1221  BP: 133/88 94/72  Pulse: 82 89  Temp:    Resp:      Last Pain:  Filed Vitals:   07/30/15 1251  PainSc: 0-No pain         Complications: No apparent anesthesia complications

## 2015-07-31 ENCOUNTER — Encounter (HOSPITAL_COMMUNITY): Payer: Self-pay | Admitting: Obstetrics & Gynecology

## 2015-07-31 DIAGNOSIS — D62 Acute posthemorrhagic anemia: Secondary | ICD-10-CM

## 2015-07-31 LAB — CBC
HEMATOCRIT: 27.5 % — AB (ref 36.0–46.0)
HEMOGLOBIN: 9 g/dL — AB (ref 12.0–15.0)
MCH: 27.8 pg (ref 26.0–34.0)
MCHC: 32.7 g/dL (ref 30.0–36.0)
MCV: 84.9 fL (ref 78.0–100.0)
Platelets: 177 10*3/uL (ref 150–400)
RBC: 3.24 MIL/uL — ABNORMAL LOW (ref 3.87–5.11)
RDW: 18.2 % — ABNORMAL HIGH (ref 11.5–15.5)
WBC: 12.1 10*3/uL — ABNORMAL HIGH (ref 4.0–10.5)

## 2015-07-31 LAB — COMPREHENSIVE METABOLIC PANEL
ALK PHOS: 87 U/L (ref 38–126)
ALT: 12 U/L — ABNORMAL LOW (ref 14–54)
ANION GAP: 7 (ref 5–15)
AST: 27 U/L (ref 15–41)
Albumin: 2.3 g/dL — ABNORMAL LOW (ref 3.5–5.0)
BILIRUBIN TOTAL: 0.5 mg/dL (ref 0.3–1.2)
BUN: 7 mg/dL (ref 6–20)
CHLORIDE: 105 mmol/L (ref 101–111)
CO2: 23 mmol/L (ref 22–32)
CREATININE: 0.63 mg/dL (ref 0.44–1.00)
Calcium: 8.9 mg/dL (ref 8.9–10.3)
Glucose, Bld: 102 mg/dL — ABNORMAL HIGH (ref 65–99)
Potassium: 4 mmol/L (ref 3.5–5.1)
Sodium: 135 mmol/L (ref 135–145)
Total Protein: 5.5 g/dL — ABNORMAL LOW (ref 6.5–8.1)

## 2015-07-31 LAB — SYPHILIS: RPR W/REFLEX TO RPR TITER AND TREPONEMAL ANTIBODIES, TRADITIONAL SCREENING AND DIAGNOSIS ALGORITHM: RPR Ser Ql: NONREACTIVE

## 2015-07-31 LAB — URIC ACID: URIC ACID, SERUM: 4 mg/dL (ref 2.3–6.6)

## 2015-07-31 NOTE — Lactation Note (Signed)
This note was copied from a baby's chart. Lactation Consultation Note  Follow up visit made.  Mom states she is pumping every 3 hours and obtaining small amounts of colostrum.  Reassured this amount is normal and volume should increase between 3-5 days.  Mom has a DEBP at home.  Encouraged to call with concerns prn.  Patient Name: Tracy Marya AmslerCynthia Keatts ZOXWR'UToday's [name]: 07/31/2015     Maternal Data    Feeding Feeding Type: Breast Milk with Formula added Length of feed: 15 min  LATCH Score/Interventions                      Lactation Tools Discussed/Used     Consult Status      Huston FoleyMOULDEN, Sandor Arboleda S 07/31/2015, 1:24 PM

## 2015-07-31 NOTE — Progress Notes (Signed)
Patient ID: Tracy AmslerCynthia [name], female   DOB: November 12, 1985, 30 y.o.   MRN: [id number] POD # 1  Subjective: Pt reports feeling well / Pain controlled with ibuprofen Tolerating po/ Foley d/c'ed and voiding without problems/ No n/v/Flatus neg Activity: out of bed and ambulate Bleeding is spotting Newborn info:  Information for the patient's newborn:  Tracy [name], Boy Georganna [[id number]][[id number]]  female Infant in NICU / Feeding: breast   Objective: VS: Blood pressure 110/69, pulse 71, temperature 98.2 F (36.8 C), temperature source Oral, resp. rate 18.    Intake/Output Summary (Last 24 hours) at 07/31/15 0856 Last data filed at 07/31/15 0600  Gross per 24 hour  Intake 5191.25 ml  Output   3205 ml  Net 1986.25 ml      Recent Labs  07/30/15 1135 07/31/15 0526  WBC 7.9 12.1*  HGB 11.4* 9.0*  HCT 34.5* 27.5*  PLT 205 177    Blood type: O POS Rubella:   Immune   Physical Exam:  General: alert, cooperative and no distress CV: Regular rate and rhythm Resp: clear Abdomen: soft, nontender, normal bowel sounds Incision: Covered with Tegaderm and honeycomb dressing; well approximated. Uterine Fundus: firm, below umbilicus, nontender Lochia: minimal Ext: no edema, redness or tenderness in the calves or thighs    A/P: POD # 1 G1P0101 S/P C/Section d/t primary C/S @ 35wks d/t decreased fetal movement / PIH  ABL Anemia Doing well Continue routine post op orders   Signed: Demetrius RevelFISHER,Monty Spicher K, MSN, [facility] 07/31/2015, 8:56 AM

## 2015-07-31 NOTE — Lactation Note (Signed)
This note was copied from a baby's chart. Lactation Consultation Note  Assisted mom with latching baby to breast in NICU.  Discussed normal late preterm feeding norm.  Baby was sleepy but did show interest when positioned at breast.  After a few attempts he latched and suckled off and on.  Baby needed to be relatched a few times but more active as feeding progressed.  Parents pleased.  Will follow up tomorrow.  Patient Name: Boy Marya AmslerCynthia Pemble RUEAV'WToday's Date: 07/31/2015 Reason for consult: Follow-up assessment;NICU baby;Late preterm infant   Maternal Data    Feeding Feeding Type: Breast Fed Length of feed: 15 min  LATCH Score/Interventions Latch: Repeated attempts needed to sustain latch, nipple held in mouth throughout feeding, stimulation needed to elicit sucking reflex.  Audible Swallowing: A few with stimulation  Type of Nipple: Everted at rest and after stimulation  Comfort (Breast/Nipple): Soft / non-tender     Hold (Positioning): Assistance needed to correctly position infant at breast and maintain latch. Intervention(s): Breastfeeding basics reviewed;Support Pillows;Skin to skin  LATCH Score: 7  Lactation Tools Discussed/Used     Consult Status      Huston FoleyMOULDEN, Donnamarie Shankles S 07/31/2015, 4:12 PM

## 2015-07-31 NOTE — Progress Notes (Signed)
[facility] and attempted to see pt, but she was not in her room.  I will refer pt to Chaplain Janetta HoraAmanda Davee Lomax for a visit tomorrow.  Chaplain Dyanne CarrelKaty Woodard Perrell, Bcc Pager, [phone] 3:56 PM    07/31/15 1500  Clinical Encounter Type  Visited With Patient not available

## 2015-07-31 NOTE — Progress Notes (Signed)
CSW acknowledges NICU admission.    Patient screened out for psychosocial assessment since none of the following apply:  Psychosocial stressors documented in mother or baby's chart  Gestation less than 32 weeks  Code at delivery   Infant with anomalies  Please contact the Clinical Social Worker if specific needs arise, or by MOB's request.       

## 2015-08-01 NOTE — Progress Notes (Signed)
Patient ID: Tracy AmslerCynthia [name], female   DOB: 01-17-1986, 30 y.o.   MRN: [id number] Subjective: S/P Primary Cesarean Delivery d/t Decreased Fetal Movement / Non-reassuring Fetal Testing POD# 2 Information for the patient's newborn:  Tracy [name], Tracy [name] [[id number]][[id number]]  female  / circ: in NICU  Reports feeling well. Feeding: bottle Patient reports tolerating PO.  Breast symptoms: none - pumping colostrum Pain controlled with ibuprofen (OTC) and narcotic analgesics including Percocet Denies HA/SOB/C/P/N/V/dizziness. Flatus present. No BM. She reports vaginal bleeding as normal, without clots.  She is ambulating, urinating without difficult.     Objective:   VS:  Filed Vitals:   07/31/15 1000 07/31/15 1720 07/31/15 2149 08/01/15 0600  BP: 125/77 124/71 123/71 144/82  Pulse: 80 73 72 77  Temp: 98.1 F (36.7 C) 98.1 F (36.7 C) 98.9 F (37.2 C) 98.1 F (36.7 C)  TempSrc: Oral Oral Oral   Resp: 18 18 18 18   Height:      Weight:      SpO2: 99% 100% 100%      Intake/Output Summary (Last 24 hours) at 08/01/15 1011 Last data filed at 07/31/15 1504  Gross per 24 hour  Intake      0 ml  Output   1200 ml  Net  -1200 ml        Recent Labs  07/30/15 1135 07/31/15 0526  WBC 7.9 12.1*  HGB 11.4* 9.0*  HCT 34.5* 27.5*  PLT 205 177     Blood type: --/--/O POS (03/03 1637)  Rubella: Immune    Physical Exam:   General: alert, cooperative and no distress  Abdomen: soft, nontender, normal bowel sounds  Incision: clean, dry, intact and skin well-approximated with staples  Uterine Fundus: firm, 1 FB below umbilicus, nontender  Lochia: minimal  Ext: extremities normal, atraumatic, no cyanosis or edema and Homans sign is negative, no sign of DVT   Assessment/Plan: 30 y.o.   POD# 1.  S/P Cesarean Delivery.  Indications: decreased FM / non-reassuring fetal testing                Principal Problem:   Postpartum care following cesarean delivery (07/30/15) Active Problems:   Postoperative  state   Acute blood loss anemia  Doing well, stable.               Regular diet as tolerated Ambulate Routine post-op care Anticipate D/C home tomorrow  Tracy [name], Tracy [name], M, MSN, CNM 08/01/2015, 9:45 AM

## 2015-08-01 NOTE — Progress Notes (Addendum)
Initial visit with [name] at baby's bedside and then again in pt's room.  Parents laughed that Tracy [name] made his entry in an exciting way and would probably keep them on their toes.  In pt's room parents shared some sadness about leaving the baby tomorrow, but are hopeful that he may go home on Monday.  (They were told to find a pediatrician by then).  They inquired about how to go about finding a doctor and expressed gratitude that her [place] support them.  Please page as further needs arise.  Maryanna ShapeAmanda M. Carley Hammedavee Lomax, M.Div. [facility] Chaplain Pager [phone] Office [phone]     08/01/15 1215  Clinical Encounter Type  Visited With Patient and family together  Visit Type Initial  Referral From Chaplain  Spiritual Encounters  Spiritual Needs Emotional  Stress Factors  Patient Stress Factors Loss of control

## 2015-08-01 NOTE — Lactation Note (Signed)
This note was copied from a baby's chart. Lactation Consultation Note  Follow up visit made.  Mom states she put baby to breast this morning.  She is pumping every 3 hours and obtaining small amounts of colostrum.  Encouraged to continue pumping and call with concerns/assist prn.  Patient Name: Tracy [name] ZOXWR'UToday's Date: 08/01/2015 Reason for consult: Follow-up assessment;NICU baby   Maternal Data    Feeding Feeding Type: Breast Milk with Formula added Length of feed: 20 min  LATCH Score/Interventions                      Lactation Tools Discussed/Used     Consult Status Consult Status: Follow-up Date: 08/02/15 Follow-up type: In-patient    Huston FoleyMOULDEN, Maricella Filyaw S 08/01/2015, 3:08 PM

## 2015-08-02 MED ORDER — OXYCODONE HCL 5 MG PO TABS: 5.0000 mg | ORAL_TABLET | ORAL | Status: AC | PRN

## 2015-08-02 MED ORDER — SENNOSIDES-DOCUSATE SODIUM 8.6-50 MG PO TABS: 2.0000 | ORAL_TABLET | ORAL | Status: AC

## 2015-08-02 MED ORDER — IBUPROFEN 600 MG PO TABS: 600.0000 mg | ORAL_TABLET | Freq: Four times a day (QID) | ORAL | Status: AC | PRN

## 2015-08-02 MED ORDER — FERROUS SULFATE 325 (65 FE) MG PO TABS: 325.0000 mg | ORAL_TABLET | Freq: Two times a day (BID) | ORAL | Status: AC

## 2015-08-02 NOTE — Progress Notes (Signed)
Patient ID: Tracy [name], female   DOB: 1985-06-23, 30 y.o.   MRN: [id number] POD # 3  Subjective: Pt reports feeling well and eager for d/c home / Pain controlled with ibuprofen.  Has not taken percocet Tolerating po/Voiding without problems/ No n/v/Flatus pos Activity: out of bed and ambulate Bleeding is spotting Newborn info:  Information for the patient's newborn:  Tracy [name], Tracy [name] [[id number]][[id number]]  female Infant in NICU, doing well / Feeding: breast   Objective: VS: Blood pressure 139/84, pulse 71, temperature 98.6 F (37 C), temperature source Oral, resp. rate 18.   LABS:  Recent Labs  07/31/15 0526  WBC 12.1*  HGB 9.0*  PLT 177                             Physical Exam:  General: alert, cooperative and no distress CV: Regular rate and rhythm Resp: clear Abdomen: soft, nontender, normal bowel sounds Incision: Covered with Tegaderm and honeycomb dressing; well approximated.  Closed with staples Uterine Fundus: firm, below umbilicus, nontender Lochia: minimal Ext: Homans sign is negative, no sign of DVT and no edema, redness or tenderness in the calves or thighs    A/P: POD # 3 G1P0101/  S/P Primary C/Section @ 35wks, d/t decreased fetal movement and PIH.  Doing well and stable for discharge home RX's: Ibuprofen 600mg  po Q 6 hrs prn pain #30 Refill x 1 Percocet 5/325 1 - 2 tabs po every 6 hrs prn pain  #30 No refill Niferex 150mg  po QD/BID #30/#60 Refill x 1 Colace 100mg  po up to TID prn #30 Ref x 1  Return to office on 08/06/15 for staple removal    Signed: Demetrius RevelFISHER,[place] Vierling K, MSN, [facility]NP 08/02/2015,

## 2015-08-02 NOTE — Lactation Note (Signed)
This note was copied from a baby's chart. Lactation Consultation Note  Patient Name: Boy Marya AmslerCynthia Kimbley AVWUJ'WToday's Date: 08/02/2015 Reason for consult: Follow-up assessment;Infant < 6lbs;Late preterm infant;NICU baby   Follow up with forst time mom of 7269 hour old NICU infant. Mom reports that she is pumping every 3 hours and getting about 15-20 cc/pumping. She is feeling fuller and noted to have more fullness to lateral aspects of breasts. Reviewed engorgement prevention/treatment with mom. Mom reports infant did go to breast yesterday and nursed briefly. Enc her to practice as she is available and infant able. Reviewed NL Preterm BF Behavior with mom.   Mom is to be d/c home today. She has a Spectra 1 pump at home for use. Informed parents that pump rental is available and to use NICU pumps when visiting. Enc mom to pay attention to her breast with using Spectra and pump for 2 minutes post milk stops flowing. Advised her that pumping may take longer than when using Symphony based on experiences of other NICU moms.   Advised mom to continue pumping every 2-3 hours to protect milk supply. Mom voiced understanding.  Reviewed LC Services with mom, [facility]. Mom has pumping supplies for home use. Enc mom to call with questions/concerns prn.    Maternal Data Formula Feeding for Exclusion: No Has patient been taught Hand Expression?: Yes Does the patient have breastfeeding experience prior to this delivery?: No  Feeding Feeding Type: Formula Length of feed: 30 min  LATCH Score/Interventions                      Lactation Tools Discussed/Used WIC Program: No Pump Review: Setup, frequency, and cleaning;Milk Storage   Consult Status Consult Status: PRN Follow-up type: Call as needed    Ed BlalockSharon S Hice 08/02/2015, 11:08 AM

## 2015-08-02 NOTE — Progress Notes (Signed)
Discharge instructions reviewed with patient and her mother.  No equipment for home ordered at discharge.

## 2015-08-02 NOTE — Discharge Summary (Signed)
OB Discharge Summary  Patient Name: Tracy [name] DOB: [birth year] MRN: [id number]  Date of admission: 07/30/2015  Admitting diagnosis: 35w 5d gestation with decreased fetal well being Intrauterine pregnancy: [redacted]w[redacted]d     Secondary diagnosis: PIH   Date of discharge: 08/02/2015     Discharge diagnosis: Preterm Pregnancy Delivered and Gestational Hypertension     Prenatal history: G1P0101   EDC : 08/29/2015, by Last Menstrual Period  [facility] at [facility]  Primary provider : CNM Prenatal course complicated by [facility]  Prenatal Labs: ABO, Rh: --/--/O POS (03/03 1637) Antibody: NEG (03/03 1637) Rubella:   RPR: Non Reactive (05/02 1135)  HBsAg:    HIV:    GBS:                                      Hospital course:  Admitted for decreased well being and urgent c/s performed at 35w 5d Augmentation: None Delivering PROVIDER: LAVOIE, MARIE-LYNE                                                            Complications: None  Newborn Data: Live born female  Birth Weight: 6 lb 0.7 oz (2741 g) APGAR: 4, 6  Baby Feeding: Breast Disposition:NICU  Post partum procedures:None    Labs: Lab Results  Component Value Date   WBC 12.1* 07/31/2015   HGB 9.0* 07/31/2015   HCT 27.5* 07/31/2015   MCV 84.9 07/31/2015   PLT 177 07/31/2015   CMP Latest Ref Rng 07/31/2015  Glucose 65 - 99 mg/dL 782(N)  BUN 6 - 20 mg/dL 7  Creatinine 5.62 - 1.30 mg/dL 8.65  Sodium 784 - 696 mmol/L 135  Potassium 3.5 - 5.1 mmol/L 4.0  Chloride 101 - 111 mmol/L 105  CO2 22 - 32 mmol/L 23  Calcium 8.9 - 10.3 mg/dL 8.9  Total Protein 6.5 - 8.1 g/dL 2.9(B)  Total Bilirubin 0.3 - 1.2 mg/dL 0.5  Alkaline Phos 38 - 126 U/L 87  AST 15 - 41 U/L 27  ALT 14 - 54 U/L 12(L)    Physical Exam @ time of discharge:  Filed Vitals:   08/01/15 1418 08/01/15 1837 08/02/15 0557 08/02/15 1200  BP: 130/73 135/72 142/92 139/84  Pulse: 69 94 69 71  Temp: 98.2 F (36.8 C) 98.4 F (36.9 C) 98.2 F  (36.8 C) 98.6 F (37 C)  TempSrc: Oral Oral Oral Oral  Resp: Height:      Weight:      SpO2: 100% 100% 100% 100%    General: alert, cooperative and no distress Lochia: appropriate Uterine Fundus: firm Incision: Covered with Tegaderm and honeycomb dressing; well approximated. Extremities: No edema DVT Evaluation: No evidence of DVT seen on physical exam.   Discharge instructions:  "Baby and Me Booklet" and [facility] Booklet  Discharge Medications:    Medication List    TAKE these medications        ferrous sulfate 325 (65 FE) MG tablet  Take 1 tablet (325 mg total) by mouth 2 (two) times daily with a meal.     ibuprofen 600 MG tablet  Commonly known as:  ADVIL,MOTRIN  Take  1 tablet (600 mg total) by mouth every 6 (six) hours as needed.     oxyCODONE 5 MG immediate release tablet  Commonly known as:  Oxy IR/ROXICODONE  Take 1 tablet (5 mg total) by mouth every 4 (four) hours as needed (pain scale 4-7).     prenatal multivitamin Tabs tablet  Take 1 tablet by mouth daily at 12 noon.     progesterone 200 MG capsule  Commonly known as:  PROMETRIUM  Place 200 mg vaginally at bedtime.     senna-docusate 8.6-50 MG tablet  Commonly known as:  Senokot-S  Take 2 tablets by mouth daily.        Diet: routine diet  Activity: Advance as tolerated. Pelvic rest x 6 weeks.   Follow EP:PIRJJOup:Staple removal in the office 08/06/15 and post partum visit in 6 wks    Demetrius RevelFISHER,Kaylani Fromme K, MSN, [facility]WHNP 08/02/2015, 1:19 PM

## 2015-08-05 ENCOUNTER — Ambulatory Visit: Payer: Self-pay

## 2015-08-05 NOTE — Lactation Note (Signed)
This note was copied from a baby's chart. Lactation Consultation Note  Mom states supply is good and pumping is going well.  Baby latching to breast some but not doing well with bottles per mom.  Encouraged to continue pumping and call with concerns prn.  Patient Name: Boy Marya AmslerCynthia Cardy NWGNF'AToday's Date: 08/05/2015     Maternal Data    Feeding Feeding Type: Breast Milk Nipple Type: Slow - flow Length of feed: 30 min  LATCH Score/Interventions                      Lactation Tools Discussed/Used     Consult Status      Huston FoleyMOULDEN, Kaylina Cahue S 08/05/2015, 1:47 PM

## 2016-04-27 IMAGING — US US MFM OB COMP +14 WKS
1 series · 14 of 28 positions shown · non-contrast
Comparison: none

[Series 1: us mfm ob comp +14 wks · 72 acquisitions, 14 frames shown]
[im 3/72]
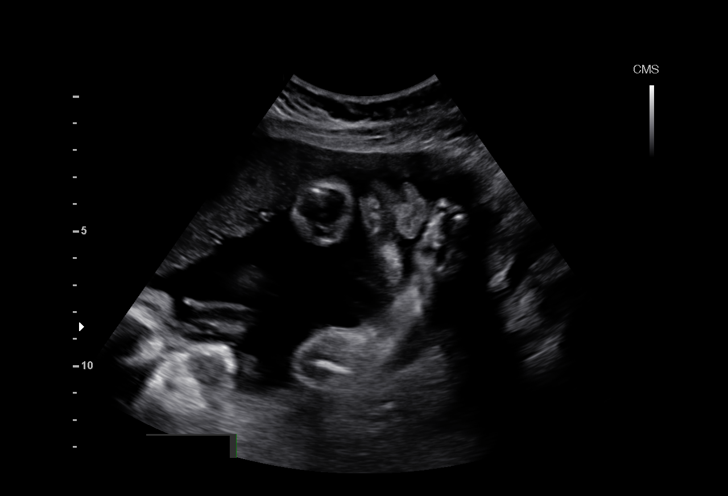
[im 8/72]
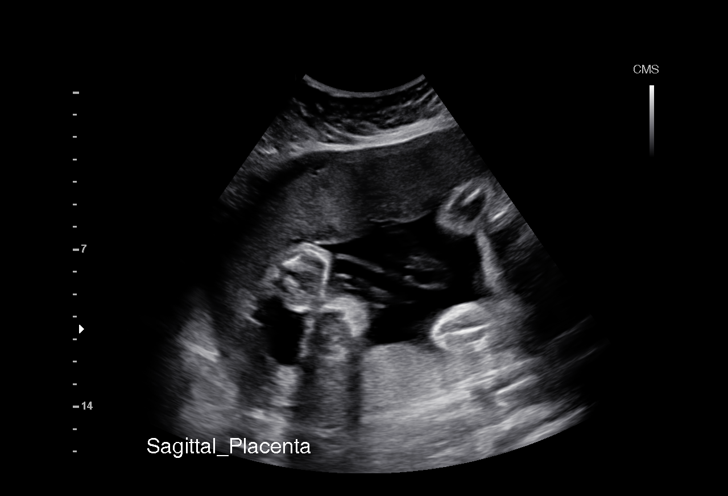
[im 14/72]
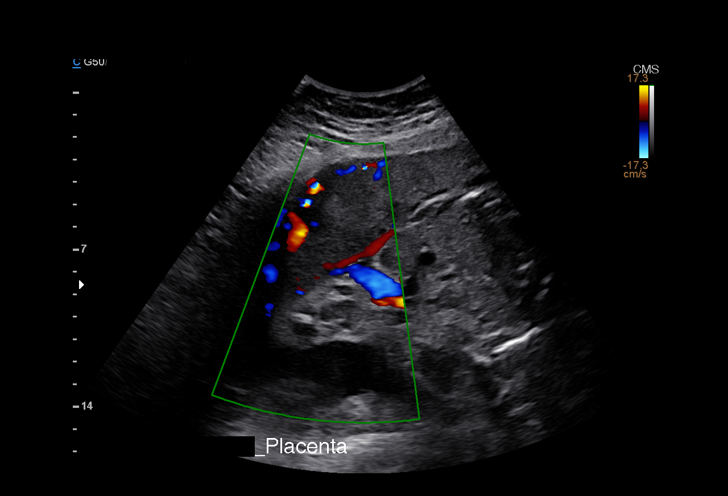
[im 19/72]
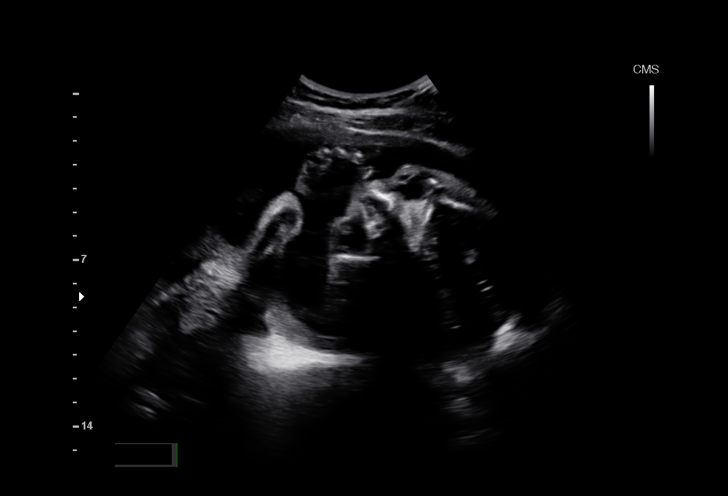
[im 24/72]
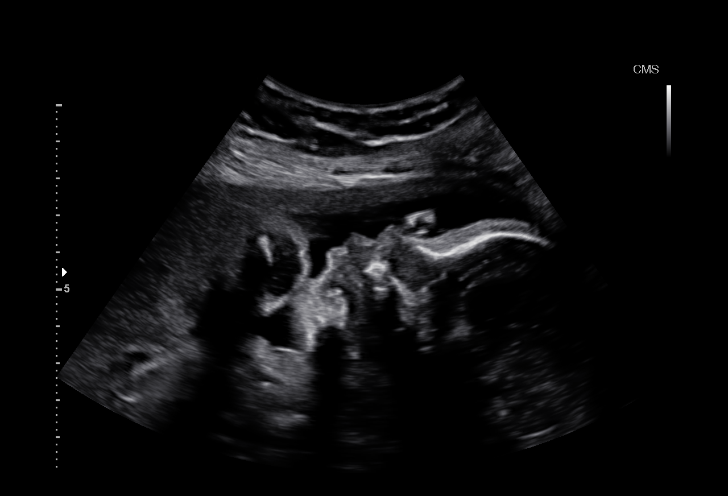
[im 29/72]
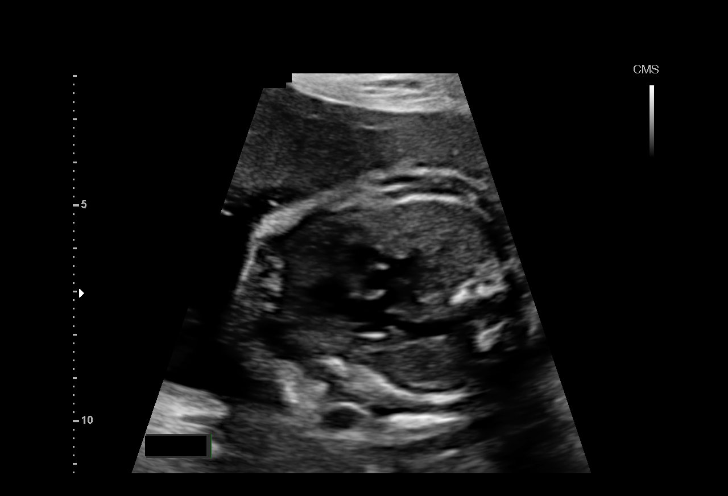
[im 35/72]
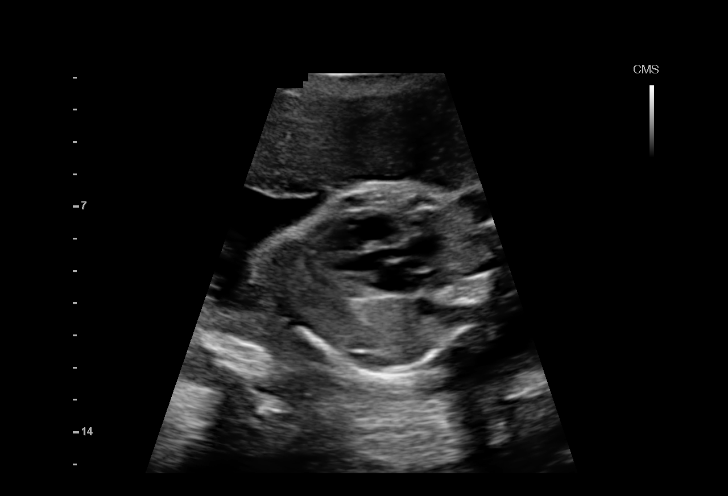
[im 40/72]
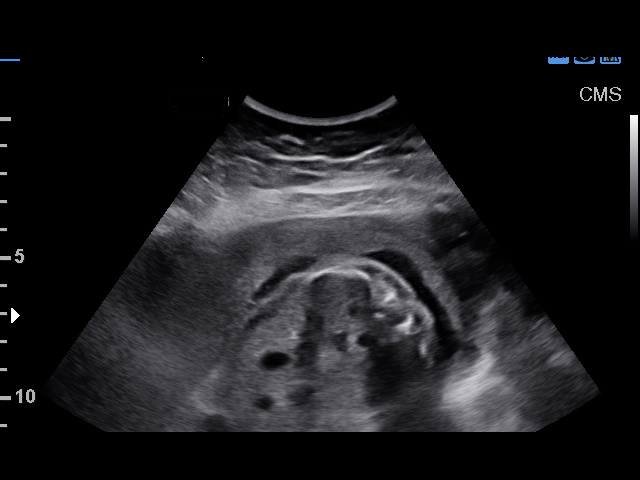
[im 45/72]
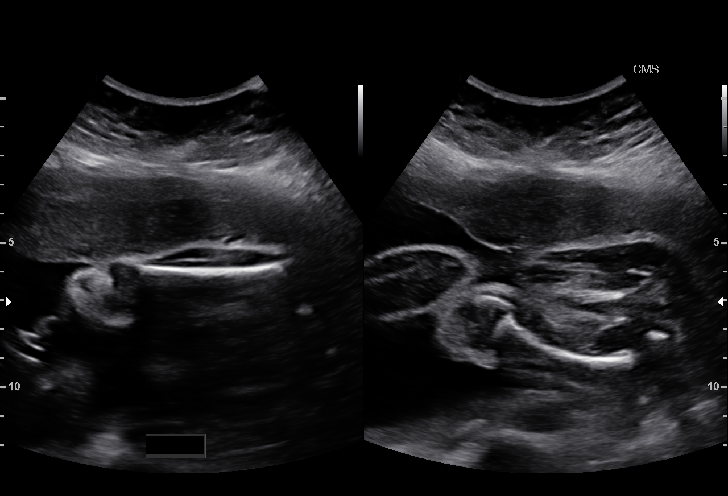
[im 50/72]
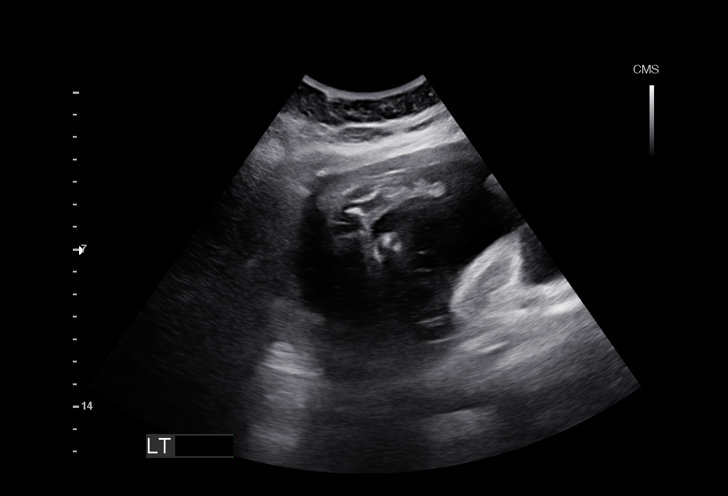
[im 56/72]
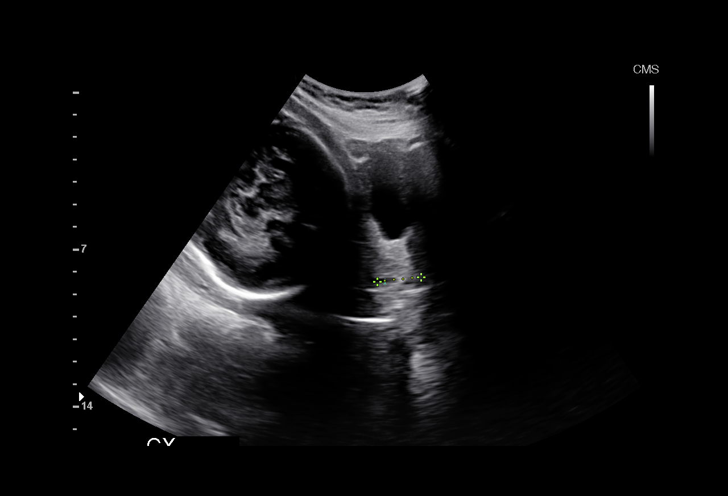
[im 61/72]
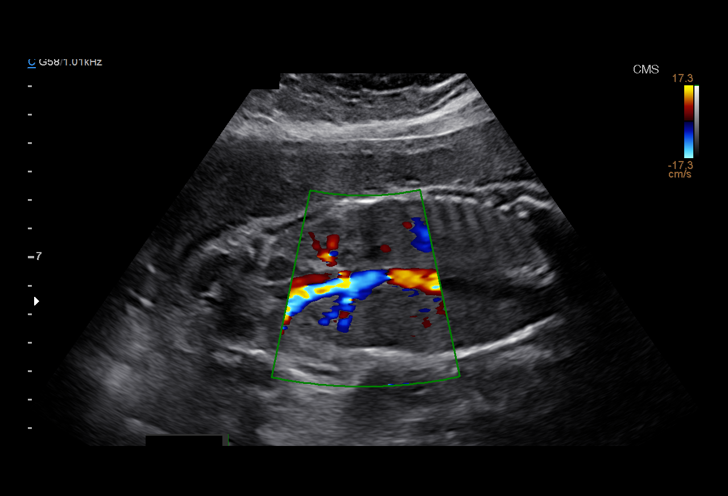
[im 66/72]
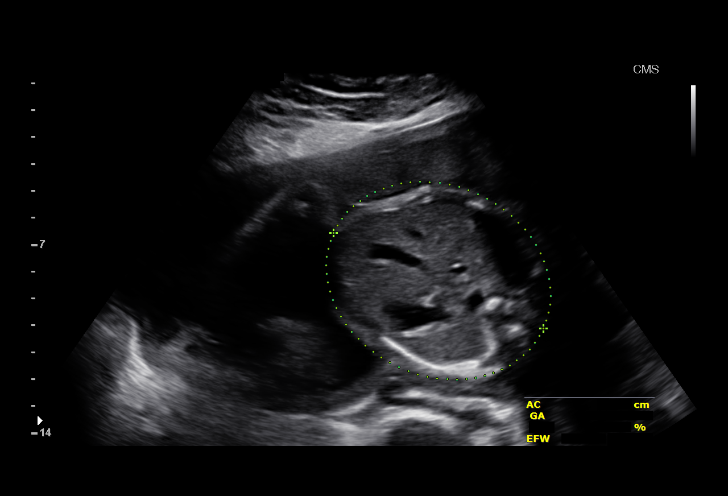
[im 72/72]
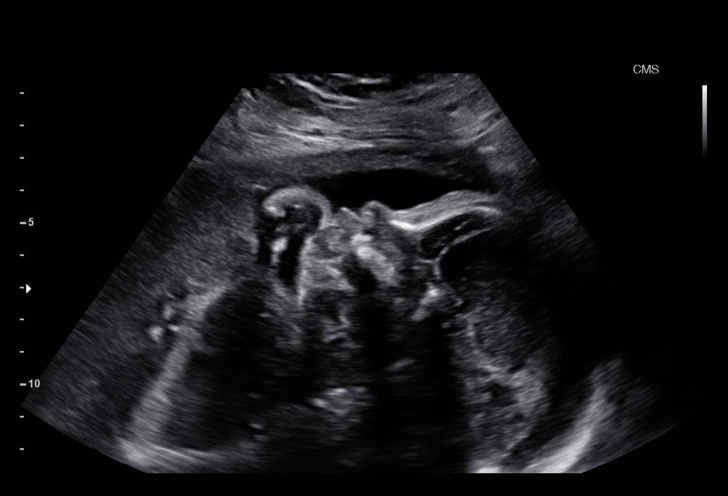

[14 of 28 positions shown; findings below may reference images not displayed]

& Infertility
9306 [REDACTED]

1  CHRISTOPHE TA          [id number]      [id number]     [id number]
Indications

27 weeks gestation of pregnancy
Cervical insufficiency, 2nd
Basic anatomic survey                          Z36
OB History

Gravidity:    1         Term:   0        Prem:   0        SAB:   0
TOP:          0       Ectopic:  0        Living: 0
Fetal Evaluation

Num Of Fetuses:     1
Fetal Heart         148
Rate(bpm):
Cardiac Activity:   Observed
Presentation:       Cephalic
Placenta:           Anterior, above cervical os

Amniotic Fluid
AFI FV:      Subjectively within normal limits
Larg Pckt:     7.1  cm
Biometry

BPD:      72.9  mm     G. Age:  29w 2d                  CI:         72.6   %   70 - 86
FL/HC:      18.3   %   18.6 -
HC:      272.1  mm     G. Age:  29w 5d         92  %    HC/AC:      1.10       1.05 -
AC:       248   mm     G. Age:  29w 0d         90  %    FL/BPD:     68.3   %   71 - 87
FL:       49.8  mm     G. Age:  26w 6d         26  %    FL/AC:      20.1   %   20 - 24
HUM:      46.6  mm     G. Age:  27w 3d         54  %
Est. FW:    0105  gm    2 lb 11 oz      76  %
Gestational Age

LMP:           27w 1d       Date:   11/22/14                 EDD:   08/29/15
U/S Today:     28w 5d                                        EDD:   08/18/15
Best:          27w 1d    Det. By:   LMP  (11/22/14)          EDD:   08/29/15
Anatomy

Cranium:          Appears normal         Aortic Arch:      Appears normal
Fetal Cavum:      Appears normal         Ductal Arch:      Appears normal
Ventricles:       Appears normal         Diaphragm:        Appears normal
Choroid Plexus:   Appears normal         Stomach:          Appears normal, left
sided
Cerebellum:       Appears normal         Abdomen:          Appears normal
Posterior Fossa:  Appears normal         Abdominal Wall:   Not well visualized
Nuchal Fold:      Not applicable (>20    Cord Vessels:     Appears normal (3
wks GA)                                  vessel cord)
Face:             Appears normal         Kidneys:          Appear normal
(orbits and profile)
Lips:             Appears normal         Bladder:          Appears normal
Fetal Thoracic:   Appears normal         Spine:            Appears normal
Heart:            Appears normal         Upper             Appears normal
(4CH, axis, and        Extremities:
situs)
RVOT:             Appears normal         Lower             Appears normal
Extremities:
LVOT:             Appears normal

Other:  Male gender. Heels and 5th digit appears normal. Nasal bone
visualized. Technically difficult due to fetal position.
Cervix Uterus Adnexa

Cervix
Not adaquately visualized

Uterus
No abnormality visualized.

Left Ovary
Within normal limits.

Right Ovary
Within normal limits.

Cul De Sac:   No free fluid seen.

Adnexa:       No abnormality visualized.
Impression

SIUP at 27+1 weeks
Normal detailed fetal anatomy; limited views of CI
Normal amniotic fluid volume
Measurements consistent with LMP dating; EFW at the 76th
%tile
Recommendations

Follow-up ultrasounds as clinically indicated.
